# Patient Record
Sex: Female | Born: 2000 | Race: Black or African American | Hispanic: No | Marital: Single | Smoking: Never smoker
Health system: Southern US, Community
[De-identification: ages and names within clinical notes are randomized; demographics above are authoritative.]

## PROBLEM LIST (undated history)

## (undated) DIAGNOSIS — F909 Attention-deficit hyperactivity disorder, unspecified type: Secondary | ICD-10-CM

## (undated) HISTORY — PX: TONSILLECTOMY: SUR1361

## (undated) HISTORY — PX: VULVA SURGERY: SHX837

---
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
## 2021-06-11 ENCOUNTER — Other Ambulatory Visit: Payer: Self-pay

## 2021-06-11 ENCOUNTER — Emergency Department (HOSPITAL_COMMUNITY)
Admission: EM | Admit: 2021-06-11 | Discharge: 2021-06-12 | Disposition: A | Discharge status: Home / Self Care | Payer: 59 | Attending: Emergency Medicine | Admitting: Emergency Medicine

## 2021-06-11 DIAGNOSIS — R112 Nausea with vomiting, unspecified: Secondary | ICD-10-CM | POA: Diagnosis not present

## 2021-06-11 DIAGNOSIS — Z91040 Latex allergy status: Secondary | ICD-10-CM | POA: Insufficient documentation

## 2021-06-11 DIAGNOSIS — R10A2 Flank pain, left side: Secondary | ICD-10-CM

## 2021-06-11 DIAGNOSIS — R300 Dysuria: Secondary | ICD-10-CM

## 2021-06-11 DIAGNOSIS — Z79899 Other long term (current) drug therapy: Secondary | ICD-10-CM | POA: Insufficient documentation

## 2021-06-11 DIAGNOSIS — R109 Unspecified abdominal pain: Secondary | ICD-10-CM | POA: Diagnosis not present

## 2021-06-12 ENCOUNTER — Emergency Department (HOSPITAL_COMMUNITY): Payer: 59

## 2021-06-12 ENCOUNTER — Encounter (HOSPITAL_COMMUNITY): Payer: Self-pay | Admitting: Emergency Medicine

## 2021-06-12 LAB — BASIC METABOLIC PANEL
Anion gap: 7 (ref 5–15)
BUN: 15 mg/dL (ref 6–20)
CO2: 27 mmol/L (ref 22–32)
Calcium: 9.1 mg/dL (ref 8.9–10.3)
Chloride: 106 mmol/L (ref 98–111)
Creatinine, Ser: 0.84 mg/dL (ref 0.44–1.00)
GFR, Estimated: >60 mL/min (ref >60)
Glucose, Bld: 60 mg/dL — ABNORMAL LOW (ref 70–99)
Potassium: 3.6 mmol/L (ref 3.5–5.1)
Sodium: 140 mmol/L (ref 135–145)

## 2021-06-12 LAB — URINALYSIS, ROUTINE W REFLEX MICROSCOPIC
Bilirubin Urine: NEGATIVE
Glucose, UA: NEGATIVE mg/dL
Hgb urine dipstick: NEGATIVE
Ketones, ur: NEGATIVE mg/dL
Leukocytes,Ua: NEGATIVE
Nitrite: NEGATIVE
Protein, ur: NEGATIVE mg/dL
Specific Gravity, Urine: 1.026 (ref 1.005–1.030)
pH: 6 (ref 5.0–8.0)

## 2021-06-12 LAB — CBC
HCT: 38.5 % (ref 36.0–46.0)
Hemoglobin: 12.7 g/dL (ref 12.0–15.0)
MCH: 29.8 pg (ref 26.0–34.0)
MCHC: 33 g/dL (ref 30.0–36.0)
MCV: 90.4 fL (ref 80.0–100.0)
Platelets: 194 10*3/uL (ref 150–400)
RBC: 4.26 MIL/uL (ref 3.87–5.11)
RDW: 13.6 % (ref 11.5–15.5)
WBC: 7.7 10*3/uL (ref 4.0–10.5)
nRBC: 0 % (ref 0.0–0.2)

## 2021-06-12 LAB — CBG MONITORING, ED: Glucose-Capillary: 92 mg/dL (ref 70–99)

## 2021-06-12 LAB — I-STAT BETA HCG BLOOD, ED (MC, WL, AP ONLY): I-stat hCG, quantitative: <5 m[IU]/mL (ref <5)

## 2021-06-12 MED ORDER — KETOROLAC TROMETHAMINE 30 MG/ML IJ SOLN
30.0000 mg | Freq: Once | INTRAMUSCULAR | Status: AC
  Administered 2021-06-12: 30 mg via INTRAVENOUS
  Filled 2021-06-12: qty 1

## 2021-06-12 MED ORDER — ONDANSETRON HCL 4 MG/2ML IJ SOLN
4.0000 mg | Freq: Once | INTRAMUSCULAR | Status: AC
  Administered 2021-06-12: 4 mg via INTRAVENOUS
  Filled 2021-06-12: qty 2

## 2021-06-12 MED ORDER — SODIUM CHLORIDE 0.9 % IV BOLUS (SEPSIS)
1000.0000 mL | Freq: Once | INTRAVENOUS | Status: AC
  Administered 2021-06-12: 1000 mL via INTRAVENOUS

## 2021-06-12 MED ORDER — LACTATED RINGERS IV BOLUS
1000.0000 mL | Freq: Once | INTRAVENOUS | Status: AC
  Administered 2021-06-12: 1000 mL via INTRAVENOUS

## 2021-06-12 MED ORDER — ONDANSETRON 8 MG PO TBDP
8.0000 mg | ORAL_TABLET | Freq: Once | ORAL | Status: AC
Start: 2021-06-12 — End: 2021-06-12
  Administered 2021-06-12: 8 mg via ORAL
  Filled 2021-06-12: qty 1

## 2021-06-12 MED ORDER — NITROFURANTOIN MONOHYD MACRO 100 MG PO CAPS: 100.0000 mg | ORAL_CAPSULE | Freq: Two times a day (BID) | ORAL | 0 refills | Status: DC

## 2021-06-12 MED ORDER — ONDANSETRON 8 MG PO TBDP
ORAL_TABLET | ORAL | 0 refills | Status: DC
Start: 2021-06-12 — End: 2023-11-29

## 2021-06-12 NOTE — ED Notes (Signed)
CBG was 92 

## 2021-06-12 NOTE — ED Triage Notes (Signed)
Pt reports that she has had difficulty urinating for the past week. Pt reports back pain and spasms for the past week. Pt reports nausea when urinating.

## 2021-06-12 NOTE — ED Notes (Signed)
Bladder scan volume: 39 mL.

## 2021-06-12 NOTE — ED Provider Notes (Signed)
[facility]-EMERGENCY DEPT Provider Note   CSN: [id number] Arrival date & time: 06/11/21  2353     History Chief Complaint  Patient presents with   Urinary Retention   Flank Pain    Julia Gross is a 20 y.o. female.  The history is provided by the patient.  Flank Pain This is a new problem. The current episode started more than 2 days ago. The problem occurs daily. The problem has been gradually worsening. Pertinent negatives include no abdominal pain. Nothing aggravates the symptoms. Nothing relieves the symptoms.  Patient presents for concern for urinary tract infection.  Patient reports for over a week she has had difficulty urinating and only urinating small volumes.  She then began having flank pain. She reports over the past day she has been having vomiting.  No fevers.  No abdominal pain.  No vaginal bleeding.  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     Family History  Problem Relation Age of Onset   Depression Mother    Anxiety disorder Mother    Depression Maternal Grandmother    Arthritis Maternal Grandmother    Diabetes Maternal Grandfather    Cancer Maternal Grandfather    ADD / ADHD Cousin     Social History   Tobacco Use   Smoking status: Never   Smokeless tobacco: Never  Substance Use Topics   Alcohol use: No   Drug use: No    Home Medications Prior to Admission medications   Medication Sig Start Date End Date Taking? Authorizing Provider   nitrofurantoin, macrocrystal-monohydrate, (MACROBID) 100 MG capsule Take 1 capsule (100 mg total) by mouth 2 (two) times daily. 06/12/21  Yes Zadie Rhine, MD  ondansetron (ZOFRAN ODT) 8 MG disintegrating tablet 8mg  ODT q4 hours prn nausea 06/12/21  Yes 06/14/21, MD  carbamide peroxide (DEBROX) 6.5 % OTIC solution Place 5 drops into the left ear 2 (two) times daily. 10/31/18   12/30/18, Amy V, PA-C  fluticasone (FLONASE) 50 MCG/ACT nasal spray Place 2 sprays into both nostrils daily. 10/31/18   12/30/18, PA-C    Allergies    Latex  Review of Systems   Review of Systems  Gastrointestinal:  Negative for abdominal pain.  Genitourinary:  Positive for difficulty urinating and flank pain.  All other systems reviewed and are negative.  Physical Exam Updated Vital Signs BP 117/63   Pulse 60   Temp 98.3 F (36.8 C) (Oral)   Resp 18   Ht 1.626 m (5\' 4" )   Wt 66.2 kg   SpO2 100%   BMI 25.06 kg/m   Physical Exam CONSTITUTIONAL: Well developed/well nourished HEAD: Normocephalic/atraumatic EYES: EOMI/PERRL ENMT: Mucous membranes moist NECK: supple no meningeal signs SPINE/BACK:entire spine nontender  CV: S1/S2 noted, no murmurs/rubs/gallops noted LUNGS: Lungs are clear to auscultation bilaterally, no apparent distress ABDOMEN: soft, nontender, no rebound or guarding GU: Left cva tenderness NEURO: Awake/alert,  equal motor 5/5 strength noted with the following: hip flexion/knee flexion/extension, foot dorsi/plantar flexion, great toe extension intact bilaterally  Pt is able to ambulate unassisted. EXTREMITIES: pulses normal, full ROM SKIN: warm, color normal PSYCH: no abnormalities of mood noted, alert and oriented to situation  ED Results / Procedures / Treatments   Labs (all labs ordered are listed, but only abnormal results are displayed) Labs Reviewed  BASIC METABOLIC PANEL - Abnormal; Notable for the following components:      Result Value   Glucose, Bld 60 (*)    All  other components within normal limits  URINALYSIS, ROUTINE W REFLEX MICROSCOPIC  CBC  I-STAT BETA HCG BLOOD, ED ([facility], [facility], [facility] ONLY)  CBG MONITORING, ED    EKG None  Radiology CT Renal Stone Study  Result Date: 06/12/2021 CLINICAL DATA:  20 year old female with difficulty urinating for the past week. Back and flank pain with spasm. Nausea when urinating. EXAM: CT ABDOMEN AND PELVIS WITHOUT CONTRAST TECHNIQUE: Multidetector CT imaging of the abdomen and pelvis was performed following the standard protocol without IV contrast. COMPARISON:  None. FINDINGS: Lower chest: Negative. Hepatobiliary: Contracted gallbladder.  Negative noncontrast liver. Pancreas: Negative. Spleen: Negative. Adrenals/Urinary Tract: Normal adrenal glands. Noncontrast kidneys appears symmetric and nonobstructed. No nephrolithiasis or perinephric inflammation. Proximal ureters seem symmetric and decompressed. Unremarkable bladder. Stomach/Bowel: Mildly redundant large bowel with retained stool. Normal appendix is visible on coronal image 65 tracking cephalad and posterior. No large bowel inflammation. No dilated small bowel. Stomach and duodenum are decompressed. No free air, free fluid, or mesenteric inflammation is identified. Vascular/Lymphatic: Normal caliber abdominal aorta. No calcified atherosclerosis. No lymphadenopathy is evident. Reproductive: Negative noncontrast appearance. Other: No pelvic free fluid is evident. Musculoskeletal: Spina bifida occulta at T12, normal variant. 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 No pars fracture or spondylolisthesis. No acute osseous abnormality identified. IMPRESSION: 1. Largely negative noncontrast CT Abdomen and Pelvis. No urinary calculus or obstructive uropathy. Normal appendix. 2. 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 No spondylolisthesis. Electronically Signed   By: Odessa Fleming M.D.   On: 06/12/2021 05:41     Procedures Procedures   Medications Ordered in ED Medications  ondansetron (ZOFRAN-ODT) disintegrating tablet 8 mg (8 mg Oral Given 06/12/21 0216)  sodium chloride 0.9 % bolus 1,000 mL (0 mLs Intravenous Stopped 06/12/21 0518)  ondansetron (ZOFRAN) injection 4 mg (4 mg Intravenous Given 06/12/21 0346)  ketorolac (TORADOL) 30 MG/ML injection 30 mg (30 mg Intravenous Given 06/12/21 0347)  ondansetron (ZOFRAN) injection 4 mg (4 mg Intravenous Given 06/12/21 0526)  lactated ringers bolus 1,000 mL (1,000 mLs Intravenous New Bag/Given 06/12/21 0525)    ED Course  I have reviewed the triage vital signs and the nursing notes.  Pertinent labs results that were available during my care of the patient were reviewed by me and considered in my medical decision making (see chart for details).    MDM Rules/Calculators/A&P                           Patient reports she been having difficulty urinating and flank pain.  Patient had no signs of urinary retention on initial evaluation.  Labs were overall reassuring.  We attempted oral medications, and on reassessment patient was leaning over a trash can and spitting. Will place IV to give fluids and medication  Patient still reporting nausea, vomiting and pain.  Due to persistent flank pain,  will proceed with CT renal studies 6:27 AM Patient is improving.  When I reevaluated patient she was eating [place] fries.  Pain appears controlled.  Extensive evaluation has been unrevealing.  Due to dysuria/difficulty urinating, patient request antibiotics and nausea medicines.  Will refer to urology. Final Clinical Impression(s) / ED Diagnoses Final diagnoses:  Dysuria  Left flank pain    Rx / DC Orders ED Discharge Orders          Ordered    ondansetron (ZOFRAN ODT) 8 MG disintegrating tablet        06/12/21 0621    nitrofurantoin, macrocrystal-monohydrate, (MACROBID) 100 MG capsule  2 times daily        06/12/21 4742             Zadie Rhine,  MD 06/12/21 [phone]

## 2021-11-28 ENCOUNTER — Other Ambulatory Visit: Payer: Self-pay

## 2021-11-28 ENCOUNTER — Encounter (HOSPITAL_COMMUNITY): Payer: Self-pay | Admitting: Emergency Medicine

## 2021-11-28 ENCOUNTER — Emergency Department (HOSPITAL_COMMUNITY)
Admission: EM | Admit: 2021-11-28 | Discharge: 2021-11-28 | Disposition: A | Discharge status: Home / Self Care | Payer: 59 | Attending: Emergency Medicine | Admitting: Emergency Medicine

## 2021-11-28 DIAGNOSIS — Z20822 Contact with and (suspected) exposure to covid-19: Secondary | ICD-10-CM | POA: Diagnosis not present

## 2021-11-28 DIAGNOSIS — Z91040 Latex allergy status: Secondary | ICD-10-CM | POA: Diagnosis not present

## 2021-11-28 DIAGNOSIS — Z79899 Other long term (current) drug therapy: Secondary | ICD-10-CM | POA: Diagnosis not present

## 2021-11-28 DIAGNOSIS — R0602 Shortness of breath: Secondary | ICD-10-CM | POA: Insufficient documentation

## 2021-11-28 DIAGNOSIS — J029 Acute pharyngitis, unspecified: Secondary | ICD-10-CM | POA: Insufficient documentation

## 2021-11-28 DIAGNOSIS — R55 Syncope and collapse: Secondary | ICD-10-CM | POA: Insufficient documentation

## 2021-11-28 LAB — CBC WITH DIFFERENTIAL/PLATELET
Abs Immature Granulocytes: 0 10*3/uL (ref 0.00–0.07)
Basophils Absolute: 0 10*3/uL (ref 0.0–0.1)
Basophils Relative: 0 %
Eosinophils Absolute: 0.3 10*3/uL (ref 0.0–0.5)
Eosinophils Relative: 4 %
HCT: 39 % (ref 36.0–46.0)
Hemoglobin: 12.7 g/dL (ref 12.0–15.0)
Lymphocytes Relative: 57 %
Lymphs Abs: 3.8 10*3/uL (ref 0.7–4.0)
MCH: 28.4 pg (ref 26.0–34.0)
MCHC: 32.6 g/dL (ref 30.0–36.0)
MCV: 87.2 fL (ref 80.0–100.0)
Monocytes Absolute: 0.4 10*3/uL (ref 0.1–1.0)
Monocytes Relative: 6 %
Neutro Abs: 2.2 10*3/uL (ref 1.7–7.7)
Neutrophils Relative %: 33 %
Platelets: 164 10*3/uL (ref 150–400)
RBC: 4.47 MIL/uL (ref 3.87–5.11)
RDW: 13.3 % (ref 11.5–15.5)
WBC: 6.6 10*3/uL (ref 4.0–10.5)
nRBC: 0 % (ref 0.0–0.2)
nRBC: 0 /100 WBC

## 2021-11-28 LAB — BASIC METABOLIC PANEL
Anion gap: 7 (ref 5–15)
BUN: 14 mg/dL (ref 6–20)
CO2: 25 mmol/L (ref 22–32)
Calcium: 8.8 mg/dL — ABNORMAL LOW (ref 8.9–10.3)
Chloride: 103 mmol/L (ref 98–111)
Creatinine, Ser: 0.69 mg/dL (ref 0.44–1.00)
GFR, Estimated: >60 mL/min (ref >60)
Glucose, Bld: 96 mg/dL (ref 70–99)
Potassium: 3.6 mmol/L (ref 3.5–5.1)
Sodium: 135 mmol/L (ref 135–145)

## 2021-11-28 LAB — RESP PANEL BY RT-PCR (FLU A&B, COVID) ARPGX2
Influenza A by PCR: NEGATIVE
Influenza B by PCR: NEGATIVE
SARS Coronavirus 2 by RT PCR: NEGATIVE

## 2021-11-28 LAB — I-STAT BETA HCG BLOOD, ED (MC, WL, AP ONLY): I-stat hCG, quantitative: <5 m[IU]/mL (ref <5)

## 2021-11-28 LAB — GROUP A STREP BY PCR: Group A Strep by PCR: NOT DETECTED

## 2021-11-28 LAB — MONONUCLEOSIS SCREEN: Mono Screen: NEGATIVE

## 2021-11-28 MED ORDER — ACETAMINOPHEN 325 MG PO TABS
650.0000 mg | ORAL_TABLET | Freq: Once | ORAL | Status: AC
  Administered 2021-11-28: 650 mg via ORAL
  Filled 2021-11-28: qty 2

## 2021-11-28 MED ORDER — SODIUM CHLORIDE 0.9 % IV BOLUS
1000.0000 mL | Freq: Once | INTRAVENOUS | Status: AC
  Administered 2021-11-28: 1000 mL via INTRAVENOUS

## 2021-11-28 NOTE — Discharge Instructions (Addendum)
You were seen in the emergency department for sore throat and a near fainting spell.  You had blood work EKG, strep test, COVID and flu test that did not show an obvious explanation for your symptoms.  Please use warm salt water gargle for your throat.  Keep well-hydrated.  Schedule a follow-up appointment with your primary care doctor.  Return to the emergency department if any worsening or concerning symptoms

## 2021-11-28 NOTE — ED Provider Notes (Signed)
[facility] EMERGENCY DEPARTMENT Provider Note   CSN: [id number] Arrival date & time: 11/28/21  1549     History  Chief Complaint  Patient presents with   Sore Throat    Julia Gross is a 21 y.o. female. She states she was running yesterday and collapsed being weak all over.  She denies syncope.  She is felt weak since then and has had an on-and-off sore throat.  Worse with swallowing.  No fevers or chills.  No sick contacts or recent travel.  No chest pain.  She said sometimes she gets tingling in her hands and feet.  The history is provided by the patient.  Sore Throat This is a new problem. The current episode started yesterday. The problem has not changed since onset.Associated symptoms include shortness of breath. Pertinent negatives include no chest pain, no abdominal pain and no headaches. The symptoms are aggravated by swallowing. Nothing relieves the symptoms. She has tried nothing for the symptoms. The treatment provided no relief.      Home Medications Prior to Admission medications   Medication Sig Start Date End Date Taking? Authorizing Provider  carbamide peroxide (DEBROX) 6.5 % OTIC solution Place 5 drops into the left ear 2 (two) times daily. 10/31/18   Cathie Hoops, Amy V, PA-C  fluticasone (FLONASE) 50 MCG/ACT nasal spray Place 2 sprays into both nostrils daily. 10/31/18   Cathie Hoops, Amy V, PA-C  nitrofurantoin, macrocrystal-monohydrate, (MACROBID) 100 MG capsule Take 1 capsule (100 mg total) by mouth 2 (two) times daily. 06/12/21   Zadie Rhine, MD  ondansetron (ZOFRAN ODT) 8 MG disintegrating tablet 8mg  ODT q4 hours prn nausea 06/12/21   06/14/21, MD      Allergies    Latex    Review of Systems   Review of Systems  Constitutional:  Positive for fatigue. Negative for fever.  HENT:  Positive for sore throat.   Respiratory:  Positive for shortness of breath.   Cardiovascular:  Negative for chest pain.  Gastrointestinal:  Negative for abdominal pain.   Genitourinary:  Negative for dysuria.  Musculoskeletal:  Negative for neck pain.  Skin:  Negative for rash.  Neurological:  Positive for light-headedness. Negative for headaches.   Physical Exam Updated Vital Signs BP 136/89 (BP Location: Right Arm)    Pulse (!) 113    Temp 98.8 F (37.1 C) (Oral)    Resp 16    LMP 11/26/2021    SpO2 100%  Physical Exam Vitals and nursing note reviewed.  Constitutional:      General: She is not in acute distress.    Appearance: She is well-developed.  HENT:     Head: Normocephalic and atraumatic.     Mouth/Throat:     Mouth: Mucous membranes are moist. No oral lesions.     Pharynx: Oropharynx is clear. Uvula midline. No oropharyngeal exudate, posterior oropharyngeal erythema or uvula swelling.  Eyes:     Conjunctiva/sclera: Conjunctivae normal.  Cardiovascular:     Rate and Rhythm: Normal rate and regular rhythm.     Heart sounds: No murmur heard. Pulmonary:     Effort: Pulmonary effort is normal. No respiratory distress.     Breath sounds: Normal breath sounds.  Abdominal:     Palpations: Abdomen is soft.     Tenderness: There is no abdominal tenderness.  Musculoskeletal:        General: No swelling.     Cervical back: Neck supple.  Skin:    General: Skin is warm and  dry.     Capillary Refill: Capillary refill takes less than 2 seconds.  Neurological:     General: No focal deficit present.     Mental Status: She is alert.     Cranial Nerves: No cranial nerve deficit.     Sensory: No sensory deficit.     Motor: No weakness.     Gait: Gait normal.  Psychiatric:        Mood and Affect: Mood normal.    ED Results / Procedures / Treatments   Labs (all labs ordered are listed, but only abnormal results are displayed) Labs Reviewed  BASIC METABOLIC PANEL - Abnormal; Notable for the following components:      Result Value   Calcium 8.8 (*)    All other components within normal limits  GROUP A STREP BY PCR  RESP PANEL BY RT-PCR (FLU  A&B, COVID) ARPGX2  CBC WITH DIFFERENTIAL/PLATELET  MONONUCLEOSIS SCREEN  I-STAT BETA HCG BLOOD, ED ([facility], [facility], [facility] ONLY)    EKG EKG Interpretation  Date/Time:  Sunday November 28 2021 18:58:45 EST Ventricular Rate:  82 PR Interval:  142 QRS Duration: 90 QT Interval:  366 QTC Calculation: 427 R Axis:   74 Text Interpretation: Normal sinus rhythm Normal ECG When compared with ECG of 16-Nov-2017 17:47, No significant change since last tracing Confirmed by Meridee Score [phone]) on 11/28/2021 7:02:06 PM  Radiology No results found.  Procedures Procedures    Medications Ordered in ED Medications  sodium chloride 0.9 % bolus 1,000 mL (has no administration in time range)    ED Course/ Medical Decision Making/ A&P Clinical Course as of 11/28/21 2149  Wynelle Link Nov 28, 2021  1913 Patient eating dinner without any difficulty. [MB]    Clinical Course User Index [MB] Terrilee Files, MD                           Medical Decision Making Amount and/or Complexity of Data Reviewed Labs: ordered.  Risk OTC drugs.  Julia Gross was evaluated in Emergency Department on 11/28/2021 for the symptoms described in the history of present illness. She was evaluated in the context of the global COVID-19 pandemic, which necessitated consideration that the patient might be at risk for infection with the SARS-CoV-2 virus that causes COVID-19. Institutional protocols and algorithms that pertain to the evaluation of patients at risk for COVID-19 are in a state of rapid change based on information released by regulatory bodies including the CDC and federal and state organizations. These policies and algorithms were followed during the patient's care in the ED.  This patient complains of sore throat, feeling weak, near syncope; this involves an extensive number of treatment Options and is a complaint that carries with it a high risk of complications and Morbidity. The differential includes dehydration,  strep, COVID, flu, metabolic derangement, anemia  I ordered, reviewed and interpreted labs, which included CBC with normal white count normal hemoglobin, chemistries normal, COVID and flu negative, strep negative, pregnancy test negative, mono negative I ordered medication IV fluids  Additional history obtained from patient's mother, sounds like she has had multiple episodes of near syncope. Previous records obtained and reviewed in epic no recent admissions  After the interventions stated above, I reevaluated the patient and found patient to be awake alert hemodynamically stable.Lab work-up unrevealing.  Recommended close follow-up with her treating providers.  Return instructions discussed no indications for admission at this time  Final Clinical Impression(s) / ED Diagnoses Final diagnoses:  Sore throat  Near syncope    Rx / DC Orders ED Discharge Orders     None         Terrilee FilesButler, Taylon Coole C, MD 11/28/21 2153

## 2021-11-28 NOTE — ED Triage Notes (Signed)
Pt states, "I lost the ability to run yesterday."  States she was in a store and was running to someone and her "body collapsed" and she fell to the ground.  Denies LOC.  Denies injury.  Reports sore throat and difficulty swallowing today.

## 2021-12-23 ENCOUNTER — Inpatient Hospital Stay (HOSPITAL_COMMUNITY): Payer: 59

## 2021-12-23 ENCOUNTER — Inpatient Hospital Stay (HOSPITAL_COMMUNITY)
Admission: AD | Admit: 2021-12-23 | Discharge: 2021-12-24 | Disposition: A | Discharge status: Home / Self Care | Payer: 59 | Attending: Emergency Medicine | Admitting: Emergency Medicine

## 2021-12-23 ENCOUNTER — Encounter (HOSPITAL_COMMUNITY): Payer: Self-pay | Admitting: Obstetrics & Gynecology

## 2021-12-23 ENCOUNTER — Other Ambulatory Visit: Payer: Self-pay

## 2021-12-23 DIAGNOSIS — Z3202 Encounter for pregnancy test, result negative: Secondary | ICD-10-CM | POA: Diagnosis not present

## 2021-12-23 DIAGNOSIS — R1032 Left lower quadrant pain: Secondary | ICD-10-CM | POA: Diagnosis present

## 2021-12-23 DIAGNOSIS — R110 Nausea: Secondary | ICD-10-CM | POA: Diagnosis not present

## 2021-12-23 DIAGNOSIS — N939 Abnormal uterine and vaginal bleeding, unspecified: Secondary | ICD-10-CM | POA: Insufficient documentation

## 2021-12-23 DIAGNOSIS — M549 Dorsalgia, unspecified: Secondary | ICD-10-CM | POA: Insufficient documentation

## 2021-12-23 LAB — WET PREP, GENITAL
Clue Cells Wet Prep HPF POC: NONE SEEN
Sperm: NONE SEEN
Trich, Wet Prep: NONE SEEN
WBC, Wet Prep HPF POC: <10 (ref <10)
Yeast Wet Prep HPF POC: NONE SEEN

## 2021-12-23 LAB — CBC WITH DIFFERENTIAL/PLATELET
Abs Immature Granulocytes: 0.01 10*3/uL (ref 0.00–0.07)
Basophils Absolute: 0 10*3/uL (ref 0.0–0.1)
Basophils Relative: 1 %
Eosinophils Absolute: 0.4 10*3/uL (ref 0.0–0.5)
Eosinophils Relative: 5 %
HCT: 34.8 % — ABNORMAL LOW (ref 36.0–46.0)
Hemoglobin: 11.2 g/dL — ABNORMAL LOW (ref 12.0–15.0)
Immature Granulocytes: 0 %
Lymphocytes Relative: 53 %
Lymphs Abs: 4.1 10*3/uL — ABNORMAL HIGH (ref 0.7–4.0)
MCH: 28.6 pg (ref 26.0–34.0)
MCHC: 32.2 g/dL (ref 30.0–36.0)
MCV: 89 fL (ref 80.0–100.0)
Monocytes Absolute: 0.6 10*3/uL (ref 0.1–1.0)
Monocytes Relative: 8 %
Neutro Abs: 2.5 10*3/uL (ref 1.7–7.7)
Neutrophils Relative %: 33 %
Platelets: 149 10*3/uL — ABNORMAL LOW (ref 150–400)
RBC: 3.91 MIL/uL (ref 3.87–5.11)
RDW: 14.1 % (ref 11.5–15.5)
WBC: 7.7 10*3/uL (ref 4.0–10.5)
nRBC: 0 % (ref 0.0–0.2)

## 2021-12-23 LAB — COMPREHENSIVE METABOLIC PANEL
ALT: 22 U/L (ref 0–44)
AST: 31 U/L (ref 15–41)
Albumin: 3.2 g/dL — ABNORMAL LOW (ref 3.5–5.0)
Alkaline Phosphatase: 50 U/L (ref 38–126)
Anion gap: 5 (ref 5–15)
BUN: 14 mg/dL (ref 6–20)
CO2: 28 mmol/L (ref 22–32)
Calcium: 8.4 mg/dL — ABNORMAL LOW (ref 8.9–10.3)
Chloride: 105 mmol/L (ref 98–111)
Creatinine, Ser: 0.8 mg/dL (ref 0.44–1.00)
GFR, Estimated: >60 mL/min (ref >60)
Glucose, Bld: 64 mg/dL — ABNORMAL LOW (ref 70–99)
Potassium: 3.5 mmol/L (ref 3.5–5.1)
Sodium: 138 mmol/L (ref 135–145)
Total Bilirubin: 0.6 mg/dL (ref 0.3–1.2)
Total Protein: 6.1 g/dL — ABNORMAL LOW (ref 6.5–8.1)

## 2021-12-23 LAB — LIPASE, BLOOD: Lipase: 38 U/L (ref 11–51)

## 2021-12-23 LAB — CBG MONITORING, ED: Glucose-Capillary: 78 mg/dL (ref 70–99)

## 2021-12-23 LAB — POCT PREGNANCY, URINE: Preg Test, Ur: NEGATIVE

## 2021-12-23 MED ORDER — PROCHLORPERAZINE EDISYLATE 10 MG/2ML IJ SOLN
10.0000 mg | Freq: Once | INTRAMUSCULAR | Status: AC
  Administered 2021-12-23: 10 mg via INTRAVENOUS
  Filled 2021-12-23: qty 2

## 2021-12-23 MED ORDER — DEXTROSE 50 % IV SOLN: 1.0000 | Freq: Once | INTRAVENOUS | Status: DC

## 2021-12-23 MED ORDER — DIPHENHYDRAMINE HCL 50 MG/ML IJ SOLN
12.5000 mg | Freq: Once | INTRAMUSCULAR | Status: AC
  Administered 2021-12-23: 12.5 mg via INTRAVENOUS
  Filled 2021-12-23: qty 1

## 2021-12-23 MED ORDER — SODIUM CHLORIDE 0.9 % IV BOLUS
1000.0000 mL | Freq: Once | INTRAVENOUS | Status: AC
  Administered 2021-12-23: 1000 mL via INTRAVENOUS

## 2021-12-23 MED ORDER — KETOROLAC TROMETHAMINE 15 MG/ML IJ SOLN
15.0000 mg | Freq: Once | INTRAMUSCULAR | Status: AC
  Administered 2021-12-23: 15 mg via INTRAVENOUS
  Filled 2021-12-23: qty 1

## 2021-12-23 NOTE — ED Provider Notes (Signed)
[facility] EMERGENCY DEPARTMENT Provider Note  History   Chief Complaint  Patient presents with   Back Pain   Vaginal Bleeding   Julia Gross is a 21 y.o. female w/ h/o L inguinal hernia s/p surgical repair, ovarian cysts, chronic nausea, chronic headaches who p/w LLQ pain.  The history is provided by the patient and the spouse.  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  HENT:  Negative for ear pain and sore throat.   Eyes:  Negative for pain and visual disturbance.  Respiratory:  Negative for cough and shortness of breath.   Cardiovascular:  Negative for chest pain and palpitations.  Gastrointestinal:  Positive for abdominal pain and nausea. Negative for blood in stool and vomiting.  Genitourinary:  Positive for vaginal bleeding. Negative for dysuria, flank pain and hematuria.  Musculoskeletal:  Negative for arthralgias and back pain.  Skin:  Negative for color change and  rash.  Neurological:  Negative for seizures and syncope.  All other systems reviewed and are negative.   Physical Exam   Today's Vitals   12/23/21 2255 12/23/21 2300 12/23/21 2330 12/24/21 0026  BP: 117/78 125/68 120/78   Pulse: 67 86 71   Resp: 20 20 16    Temp:   98 F (36.7 C)   TempSrc:   Oral   SpO2: 100% 100% 100%   Weight:      Height:      PainSc:    0-No pain     Physical Exam Vitals and nursing note reviewed.  Constitutional:      General: She is not in acute distress.    Appearance: Normal appearance. She is well-developed. She is obese.  HENT:     Head: Normocephalic and atraumatic.     Nose: Nose normal. No congestion.     Mouth/Throat:     Mouth: Mucous membranes are moist.     Pharynx: Oropharynx is clear. No oropharyngeal exudate.  Eyes:     Extraocular Movements: Extraocular movements intact.     Conjunctiva/sclera: Conjunctivae normal.     Pupils: Pupils are equal, round, and reactive to light.  Cardiovascular:     Rate and Rhythm: Regular rhythm.     Pulses: Normal pulses.     Heart sounds: Normal heart sounds. No murmur heard. Pulmonary:     Effort: Pulmonary effort is normal.  No respiratory distress.     Breath sounds: Normal breath sounds. No wheezing, rhonchi or rales.  Abdominal:     General: There is no distension.     Palpations: Abdomen is soft.     Tenderness: There is no abdominal tenderness. There is no right CVA tenderness, left CVA tenderness, guarding or rebound.  Musculoskeletal:        General: No swelling.     Cervical back: Normal range of motion and neck supple.     Right lower leg: No edema.     Left lower leg: No edema.  Skin:    General: Skin is warm and dry.     Capillary Refill: Capillary refill takes less than 2 seconds.     Findings: No rash.  Neurological:     General: No focal deficit present.     Mental Status: She is alert and oriented to person, place, and time. Mental status is at baseline.     Cranial  Nerves: No cranial nerve deficit.     Sensory: No sensory deficit.     Motor: No weakness.     Coordination: Coordination normal.  Psychiatric:        Mood and Affect: Mood normal.        Behavior: Behavior normal.    ED Course  Procedures  Medical Decision Making:  Julia Gross is a 21 y.o. female w/ h/o L inguinal hernia s/p surgical repair, ovarian cysts, chronic nausea, chronic headaches who p/w LLQ pain.  Monogamous and sexually active with one female partner  No fever No abnormal vaginal discharge now but reports foul smelling discharge once last week LMP 3 wk ago, reports starting vaginal bleeding today "like normal period" Poor po intake, endorses intermittent lightheadedness w/o syncope Today developed suprapubic/LLQ pain radiating around to low back (not flank), pain intermittent and throbbing Chronic nausea last emesis, 10d ago, uses promethazine at home Chronic intermittent non-bloody diarrhea. Last norma BM this morning  Hemodynamically stable Denies AC Denies IVDU Denies h/o AAA Denies saddle anesthesia  Denies urinary or fecal incontinence  Denies recent falls or trauma   HA on exam, no focal neuro deficit, will give migraine cocktail Pelvic exam with blood in vaginal vault, no trauma, no abnormal discharge, normal cervix, no CMT or adnexal TTP.  Abdomen soft and non-tender, no flank pain Given reported pain in LLQ, will obtain TV US to r/o torsion  ER provider interpretation of Imaging / Radiology:  Transvaginal ultrasound pelvis: Both right and left ovary no appearance, no adnexal mass, intact arterial and venous flow (thus no torsion), small amount of free fluid in the pelvis, unremarkable pelvic ultrasound.  ER provider interpretation of EKG:  Not indicated  ER provider interpretation of Labs:  UPT negative CBC WBC 7.7, Hgb 11.2 CMP: No emergent electrolyte abnormality, BG 64 (given D50), no AKI, no elevated AST/ALT Lipase 38 UA: Without UTI,  moderate blood (patient on menstrual cycle) Wet prep: Negative for yeast, trichomoniasis, or clue cells GC/chlamydia: Negative  Key medications administered in the ER:  Medications  ketorolac (TORADOL) 15 MG/ML injection 15 mg (15 mg Intravenous Given 12/23/21 2248)  prochlorperazine (COMPAZINE) injection 10 mg (10 mg Intravenous Given 12/23/21 2248)  diphenhydrAMINE (BENADRYL) injection 12.5 mg (12.5 mg Intravenous Given 12/23/21 2248)  sodium chloride 0.9 % bolus 1,000 mL (0 mLs Intravenous Stopped 12/23/21 2337)    Diagnoses considered:  Unknown etiology, may relate to ruptured small previous ovarian cyst as none seen on TV US. With presenting  history and physical exam, doubt bowel obstruction, AAA, ACS, PNA, PTX, pyelonephritis, nephrolithiasis, pancreatitis, cholecystitis, shingles, perforated bowel or ulcer, diverticulosis/diverticulitis, ischemic mesentery, inflammatory bowel disease, strangulated/incarcerated hernia, gastritis, PUD, or PID. Patient without peritoneal signs or other indication of need for surgical intervention.   I do not think that Raelene Bott is experiencing cauda equina syndrome, abdominal aortic aneurysm, epidural abscess, aortic dissection, spinal hematoma, nephrolithiasis, spinal metastasis, discitis, or an acute fracture.  Consulted: None  Given the patient's reassuring presentation, I believe that she is safe for discharge.  I provided ED return precautions, specifically for the symptoms which are most concerning (e.g., saddle anesthesia, urinary or bowel incontinence or retention, changing or worsening pain), which would necessitate immediate return.  I encouraged the patient to followup with their PCP.  Patient seen in conjunction with Dr. Nicola Girt medical dictation software was used in the creation of this note.   Electronically signed by: Wynetta Fines, MD on 12/24/2021 at 2:42 PM  Clinical Impression:  1. LLQ pain     Dispo: Discharge     Wynetta Fines, MD 12/25/21 1423    Lajean Saver, MD 12/28/21 [phone]

## 2021-12-23 NOTE — ED Notes (Signed)
Pt presents to the ED from home with complaints of ovarian cysts and vaginal bleeding for the last 4 days. Pt states she doesn't know if the cyst has ruptured, causing the pain in her pelvic area along with the bleeding, not currently on her period. Pt states she feels numb in her vagina when she has her period. Worse pain today than in the last 4 days, going through 1 pad per hour. Pt denies N/V/D. Pt states she has had increased pain to her pelvic area after urinating but doesn't feel like a normal uti, has back pain all over today. Pt resting on stretcher with family at bedside. No acute changes noted. Pt given blanket, blood work obtained and sent to lab. Will continue to monitor.

## 2021-12-23 NOTE — ED Notes (Signed)
Pt given sandwich bag and soda to improve glucose. MD states to hold D50 at this time. Pt denies any complaints of headache at this time. Pt also informed of the need for additional urine specimen. Will continue to monitor.

## 2021-12-23 NOTE — ED Notes (Signed)
Patient transported to Ultrasound 

## 2021-12-23 NOTE — MAU Provider Note (Signed)
°    None     S Ms. Julia Gross is a 21 y.o. No obstetric history on file. patient who presents to MAU today with complaint of states has had cysts on her right ovary and has been dealing with this from months. States she is having lower back pain and in her pelvis. Also has been dizzy and sick. Also having vaginal bleeding.    LMP: today   Onset of complaint: ongoing   Pain score: 4/10      Vitals:    12/23/21 1917  BP: 134/66  Pulse: 88  Resp: 16  Temp: 98.4 F (36.9 C)     Lab orders placed from triage: upt  .   O BP 134/66 (BP Location: Right Arm)    Pulse 88    Temp 98.4 F (36.9 C) (Oral)    Resp 16    LMP 11/26/2021  Physical Exam  A Medical screening exam complete  P Discharge from MAU in stable condition Patient given the option of transfer to [facility] for further evaluation or seek care in outpatient facility of choice would like to go to [facility] Christin Fudge, [place] 12/23/2021 7:20 PM

## 2021-12-23 NOTE — MAU Note (Signed)
Report given to [facility] RN 

## 2021-12-23 NOTE — MAU Note (Signed)
Julia Gross is a 21 y.o. here in MAU reporting: states has had cysts on her right ovary and has been dealing with this from months. States she is having lower back pain and in her pelvis. Also has been dizzy and sick. Also having vaginal bleeding.   LMP: today  Onset of complaint: ongoing  Pain score: 4/10  Vitals:   12/23/21 1917  BP: 134/66  Pulse: 88  Resp: 16  Temp: 98.4 F (36.9 C)     Lab orders placed from triage: upt

## 2021-12-24 LAB — URINALYSIS, ROUTINE W REFLEX MICROSCOPIC
Bacteria, UA: NONE SEEN
Bilirubin Urine: NEGATIVE
Glucose, UA: NEGATIVE mg/dL
Ketones, ur: NEGATIVE mg/dL
Leukocytes,Ua: NEGATIVE
Nitrite: NEGATIVE
Protein, ur: NEGATIVE mg/dL
Specific Gravity, Urine: 1.024 (ref 1.005–1.030)
pH: 6 (ref 5.0–8.0)

## 2021-12-24 LAB — GC/CHLAMYDIA PROBE AMP (~~LOC~~) NOT AT ARMC
Chlamydia: NEGATIVE
Comment: NEGATIVE
Comment: NORMAL
Neisseria Gonorrhea: NEGATIVE

## 2021-12-24 NOTE — Discharge Instructions (Addendum)
Call [phone] to establish primary care with [facility] if you do not have a primary care physician.  They should manage your ongoing multiple chronic complaints.  Ultrasound of your abdomen did not reveal any ovarian torsion.  The wet prep did not reveal any yeast infection or bacterial vaginosis.  Your GC/chlamydia test is pending, please create a MyChart to follow-up on this, if positive then please obtain antibiotics to treat this.  I suspect this will be negative as your cervix appeared unremarkable on my examination and you are not having any abnormal vaginal discharge.  Your urine did not reveal urinary tract infection.  Your headache improved with a migraine cocktail.  Your blood glucose improved with juice at bedside.  Please focus on eating regularly, as low blood sugar can certainly cause you to feel lightheaded and pass out.  Please focus on hydration as this will assist with lightheadedness.

## 2021-12-25 ENCOUNTER — Encounter (HOSPITAL_BASED_OUTPATIENT_CLINIC_OR_DEPARTMENT_OTHER): Payer: Self-pay | Admitting: Emergency Medicine

## 2021-12-25 ENCOUNTER — Other Ambulatory Visit: Payer: Self-pay

## 2021-12-25 ENCOUNTER — Emergency Department (HOSPITAL_BASED_OUTPATIENT_CLINIC_OR_DEPARTMENT_OTHER): Payer: 59

## 2021-12-25 ENCOUNTER — Emergency Department (HOSPITAL_BASED_OUTPATIENT_CLINIC_OR_DEPARTMENT_OTHER)
Admission: EM | Admit: 2021-12-25 | Discharge: 2021-12-25 | Disposition: A | Discharge status: Home / Self Care | Payer: 59 | Attending: Emergency Medicine | Admitting: Emergency Medicine

## 2021-12-25 DIAGNOSIS — R110 Nausea: Secondary | ICD-10-CM | POA: Diagnosis not present

## 2021-12-25 DIAGNOSIS — W19XXXA Unspecified fall, initial encounter: Secondary | ICD-10-CM | POA: Diagnosis not present

## 2021-12-25 DIAGNOSIS — S8992XA Unspecified injury of left lower leg, initial encounter: Secondary | ICD-10-CM | POA: Diagnosis present

## 2021-12-25 DIAGNOSIS — Z91040 Latex allergy status: Secondary | ICD-10-CM | POA: Insufficient documentation

## 2021-12-25 DIAGNOSIS — S80212A Abrasion, left knee, initial encounter: Secondary | ICD-10-CM | POA: Diagnosis not present

## 2021-12-25 DIAGNOSIS — R531 Weakness: Secondary | ICD-10-CM | POA: Diagnosis not present

## 2021-12-25 DIAGNOSIS — R519 Headache, unspecified: Secondary | ICD-10-CM | POA: Diagnosis not present

## 2021-12-25 DIAGNOSIS — R209 Unspecified disturbances of skin sensation: Secondary | ICD-10-CM | POA: Insufficient documentation

## 2021-12-25 LAB — CBC WITH DIFFERENTIAL/PLATELET
Abs Immature Granulocytes: 0.01 10*3/uL (ref 0.00–0.07)
Basophils Absolute: 0 10*3/uL (ref 0.0–0.1)
Basophils Relative: 1 %
Eosinophils Absolute: 0.2 10*3/uL (ref 0.0–0.5)
Eosinophils Relative: 3 %
HCT: 36.3 % (ref 36.0–46.0)
Hemoglobin: 12.3 g/dL (ref 12.0–15.0)
Immature Granulocytes: 0 %
Lymphocytes Relative: 55 %
Lymphs Abs: 3.1 10*3/uL (ref 0.7–4.0)
MCH: 29.3 pg (ref 26.0–34.0)
MCHC: 33.9 g/dL (ref 30.0–36.0)
MCV: 86.4 fL (ref 80.0–100.0)
Monocytes Absolute: 0.4 10*3/uL (ref 0.1–1.0)
Monocytes Relative: 7 %
Neutro Abs: 1.9 10*3/uL (ref 1.7–7.7)
Neutrophils Relative %: 34 %
Platelets: 162 10*3/uL (ref 150–400)
RBC: 4.2 MIL/uL (ref 3.87–5.11)
RDW: 14 % (ref 11.5–15.5)
WBC: 5.7 10*3/uL (ref 4.0–10.5)
nRBC: 0 % (ref 0.0–0.2)

## 2021-12-25 LAB — COMPREHENSIVE METABOLIC PANEL
ALT: 26 U/L (ref 0–44)
AST: 28 U/L (ref 15–41)
Albumin: 3.5 g/dL (ref 3.5–5.0)
Alkaline Phosphatase: 57 U/L (ref 38–126)
Anion gap: 6 (ref 5–15)
BUN: 12 mg/dL (ref 6–20)
CO2: 27 mmol/L (ref 22–32)
Calcium: 8.6 mg/dL — ABNORMAL LOW (ref 8.9–10.3)
Chloride: 104 mmol/L (ref 98–111)
Creatinine, Ser: 0.74 mg/dL (ref 0.44–1.00)
GFR, Estimated: >60 mL/min (ref >60)
Glucose, Bld: 73 mg/dL (ref 70–99)
Potassium: 3.4 mmol/L — ABNORMAL LOW (ref 3.5–5.1)
Sodium: 137 mmol/L (ref 135–145)
Total Bilirubin: 0.5 mg/dL (ref 0.3–1.2)
Total Protein: 7 g/dL (ref 6.5–8.1)

## 2021-12-25 LAB — URINALYSIS, ROUTINE W REFLEX MICROSCOPIC
Bilirubin Urine: NEGATIVE
Glucose, UA: NEGATIVE mg/dL
Ketones, ur: NEGATIVE mg/dL
Leukocytes,Ua: NEGATIVE
Nitrite: NEGATIVE
Protein, ur: NEGATIVE mg/dL
Specific Gravity, Urine: 1.025 (ref 1.005–1.030)
pH: 7 (ref 5.0–8.0)

## 2021-12-25 LAB — URINALYSIS, MICROSCOPIC (REFLEX)

## 2021-12-25 LAB — CK: Total CK: 187 U/L (ref 38–234)

## 2021-12-25 LAB — HCG, SERUM, QUALITATIVE: Preg, Serum: NEGATIVE

## 2021-12-25 LAB — LIPASE, BLOOD: Lipase: 38 U/L (ref 11–51)

## 2021-12-25 NOTE — ED Triage Notes (Signed)
Pt arrives pov with report of fall this morning, reports bilateral leg weakness, sent to ED by UC. Also reports HA, endorses ibuprofen at 0800 today with no relief. Pt also reports multiples complaints regarding freq falls and difficulty ambulating. Was treated at ED yesterday for pelvic issues.

## 2021-12-25 NOTE — ED Provider Notes (Signed)
[facility] EMERGENCY DEPARTMENT Provider Note   CSN: [id number] Arrival date & time: 12/25/21  1440    History  Chief Complaint  Patient presents with   Julia Gross is a 21 y.o. female here for evaluation of multiple complaints. Earlier today had a mechanical fall. HA since. Hx of similar. States she has appointment in march to see PCP due to chronic falls. States also with chronic headaches x months. No head trauma at onset of HA. Feels generally weak to BL upper and lower extremities. Was told previously she needs to see neuro for her recurrent weakness, per patient and friend in room. Occasionally has tingling "all over." No bowel or bladder incontinence, saddle anesthesia. No new neuro complaints since fall today. Has chronic nausea. Takes phenergan for similar with relief. Does use marijuana and occasional etoh. Seen 2 days ago in ED for pelic pain which has resolved. No syncope, blurred vision, midline C/T/L tenderness. No fever, CP, SOB, abd pain. Abrasion to left knee from fall. Unknown last tetanus. Ambulatory PTA. Seen by UC sent for CT head due to ha after fall.  HPI     Home Medications Prior to Admission medications   Medication Sig Start Date End Date Taking? Authorizing Provider  carbamide peroxide (DEBROX) 6.5 % OTIC solution Place 5 drops into the left ear 2 (two) times daily. 10/31/18   Cathie Hoops, Amy V, PA-C  fluticasone (FLONASE) 50 MCG/ACT nasal spray Place 2 sprays into both nostrils daily. 10/31/18   Cathie Hoops, Amy V, PA-C  nitrofurantoin, macrocrystal-monohydrate, (MACROBID) 100 MG capsule Take 1 capsule (100 mg total) by mouth 2 (two) times daily. 06/12/21   Zadie Rhine, MD  ondansetron (ZOFRAN ODT) 8 MG disintegrating tablet 8mg  ODT q4 hours prn nausea 06/12/21   Zadie Rhine, MD      Allergies    Latex    Review of Systems   Review of Systems  Constitutional: Negative.   HENT: Negative.    Respiratory: Negative.    Cardiovascular: Negative.    Gastrointestinal:  Positive for nausea (chronic).  Genitourinary: Negative.   Musculoskeletal:  Positive for myalgias (chronic myalgias).  Skin:  Positive for wound.  Neurological:  Positive for weakness (chronic generalized weakness) and headaches (chronic).  All other systems reviewed and are negative.  Physical Exam Updated Vital Signs BP 126/72 (BP Location: Right Arm)    Pulse 82    Temp 98.7 F (37.1 C) (Oral)    Resp 18    Ht 5\' 5"  (1.651 m)    Wt 68 kg    LMP 12/24/2021    SpO2 100%    BMI 24.96 kg/m  Physical Exam Physical Exam  Constitutional: Pt is oriented to person, place, and time. Pt appears well-developed and well-nourished. No distress.  HENT:  Head: Normocephalic and atraumatic.  Mouth/Throat: Oropharynx is clear and moist.  Eyes: Conjunctivae and EOM are normal. Pupils are equal, round, and reactive to light. No scleral icterus.  No horizontal, vertical or rotational nystagmus  Neck: Normal range of motion. Neck supple.  Full active and passive ROM without pain No midline or paraspinal tenderness No nuchal rigidity or meningeal signs  Cardiovascular: Normal rate, regular rhythm and intact distal pulses.   Pulmonary/Chest: Effort normal and breath sounds normal. No respiratory distress. Pt has no wheezes. No rales.  Abdominal: Soft. Bowel sounds are normal. There is no tenderness. There is no rebound and no guarding.  Musculoskeletal: Normal range of motion.  Lymphadenopathy:  No cervical adenopathy.  Neurological: Pt. is alert and oriented to person, place, and time. He has normal reflexes. No cranial nerve deficit.  Exhibits normal muscle tone. Coordination normal.  Mental Status:  Alert, oriented, thought content appropriate. Speech fluent without evidence of aphasia. Able to follow 2 step commands without difficulty.  Cranial Nerves:  II:  Peripheral visual fields grossly normal, pupils equal, round, reactive to light III,IV, VI: ptosis not present,  extra-ocular motions intact bilaterally  V,VII: smile symmetric, facial light touch sensation equal VIII: hearing grossly normal bilaterally  IX,X: midline uvula rise  XI: bilateral shoulder shrug equal and strong XII: midline tongue extension  Motor:  5/5 in upper and lower extremities bilaterally including strong and equal grip strength and dorsiflexion/plantar flexion Sensory: Pinprick and light touch normal in all extremities.  Deep Tendon Reflexes: 2+ and symmetric  Cerebellar: normal F 2 nose with bilateral upper extremities Gait: normal gait and balance CV: distal pulses palpable throughout   Skin: Skin is warm and dry. No rash noted. Pt is not diaphoretic.  Psychiatric: Pt has a normal mood and affect. Behavior is normal. Judgment and thought content normal.  Nursing note and vitals reviewed.  ED Results / Procedures / Treatments   Labs (all labs ordered are listed, but only abnormal results are displayed) Labs Reviewed  COMPREHENSIVE METABOLIC PANEL - Abnormal; Notable for the following components:      Result Value   Potassium 3.4 (*)    Calcium 8.6 (*)    All other components within normal limits  URINALYSIS, ROUTINE W REFLEX MICROSCOPIC - Abnormal; Notable for the following components:   Hgb urine dipstick LARGE (*)    All other components within normal limits  URINALYSIS, MICROSCOPIC (REFLEX) - Abnormal; Notable for the following components:   Bacteria, UA RARE (*)    All other components within normal limits  CBC WITH DIFFERENTIAL/PLATELET  LIPASE, BLOOD  HCG, SERUM, QUALITATIVE  CK    EKG None  Radiology CT Head Wo Contrast  Result Date: 12/25/2021 CLINICAL DATA:  Head trauma, moderate-severe EXAM: CT HEAD WITHOUT CONTRAST TECHNIQUE: Contiguous axial images were obtained from the base of the skull through the vertex without intravenous contrast. RADIATION DOSE REDUCTION: This exam was performed according to the departmental dose-optimization program which  includes automated exposure control, adjustment of the mA and/or kV according to patient size and/or use of iterative reconstruction technique. COMPARISON:  None. FINDINGS: Brain: No evidence of acute infarction, hemorrhage, hydrocephalus, extra-axial collection or mass lesion/mass effect. Vascular: No hyperdense vessel or unexpected calcification. Skull: Normal. Negative for fracture or focal lesion. Sinuses/Orbits: Mucosal thickening with layering debris in the right sphenoid sinus. Other: None. IMPRESSION: 1. No acute intracranial abnormality. 2. Mucosal thickening with layering debris in the right sphenoid sinus. Correlate for sinusitis. Electronically Signed   By: Duanne Guess D.O.   On: 12/25/2021 15:42   US PELVIS TRANSVAGINAL NON-OB (TV ONLY)  Result Date: 12/23/2021 CLINICAL DATA:  Left lower quadrant pain. EXAM: TRANSABDOMINAL AND TRANSVAGINAL ULTRASOUND OF PELVIS DOPPLER ULTRASOUND OF OVARIES TECHNIQUE: Both transabdominal and transvaginal ultrasound examinations of the pelvis were performed. Transabdominal technique was performed for global imaging of the pelvis including uterus, ovaries, adnexal regions, and pelvic cul-de-sac. It was necessary to proceed with endovaginal exam following the transabdominal exam to visualize the endometrium and ovaries. Color and duplex Doppler ultrasound was utilized to evaluate blood flow to the ovaries. COMPARISON:  None. FINDINGS: Uterus Measurements: 6.1 x 3.0 x 3.8 cm = volume: 36 mL.  No fibroids or other mass visualized. Endometrium Thickness: 5 mm.  No focal abnormality visualized. Right ovary Measurements: 2.9 x 1.4 x 2.5 cm = volume: 5 mL. Normal appearance/no adnexal mass. Left ovary Measurements: 2.1 x 1.3 x 1.5 cm = volume: 2 mL. Normal appearance/no adnexal mass. Pulsed Doppler evaluation of both ovaries demonstrates normal low-resistance arterial and venous waveforms. Other findings Small amount of free fluid in the pelvis. IMPRESSION: Unremarkable  pelvic ultrasound. Electronically Signed   By: Elgie Collard M.D.   On: 12/23/2021 22:31   US PELVIC DOPPLER (TORSION R/O OR MASS ARTERIAL FLOW)  Result Date: 12/23/2021 CLINICAL DATA:  Left lower quadrant pain. EXAM: TRANSABDOMINAL AND TRANSVAGINAL ULTRASOUND OF PELVIS DOPPLER ULTRASOUND OF OVARIES TECHNIQUE: Both transabdominal and transvaginal ultrasound examinations of the pelvis were performed. Transabdominal technique was performed for global imaging of the pelvis including uterus, ovaries, adnexal regions, and pelvic cul-de-sac. It was necessary to proceed with endovaginal exam following the transabdominal exam to visualize the endometrium and ovaries. Color and duplex Doppler ultrasound was utilized to evaluate blood flow to the ovaries. COMPARISON:  None. FINDINGS: Uterus Measurements: 6.1 x 3.0 x 3.8 cm = volume: 36 mL. No fibroids or other mass visualized. Endometrium Thickness: 5 mm.  No focal abnormality visualized. Right ovary Measurements: 2.9 x 1.4 x 2.5 cm = volume: 5 mL. Normal appearance/no adnexal mass. Left ovary Measurements: 2.1 x 1.3 x 1.5 cm = volume: 2 mL. Normal appearance/no adnexal mass. Pulsed Doppler evaluation of both ovaries demonstrates normal low-resistance arterial and venous waveforms. Other findings Small amount of free fluid in the pelvis. IMPRESSION: Unremarkable pelvic ultrasound. Electronically Signed   By: Elgie Collard M.D.   On: 12/23/2021 22:31   DG Knee Complete 4 Views Left  Result Date: 12/25/2021 CLINICAL DATA:  Larey Seat this morning.  Knee pain. EXAM: LEFT KNEE - COMPLETE 4+ VIEW COMPARISON:  None FINDINGS: No evidence of fracture, dislocation, or joint effusion. No evidence of arthropathy or other focal bone abnormality. Soft tissues are unremarkable. IMPRESSION: Negative. Electronically Signed   By: Norva Pavlov M.D.   On: 12/25/2021 15:43    Procedures Procedures    Medications Ordered in ED Medications - No data to display  ED Course/  Medical Decision Making/ A&P     21 year old here for evaluation of multiple complaints.  Mechanical fall earlier today. Hit posterior aspect head. Persistent HA. No LOC, anticoagulation. Some left knee pain. Ambulatory PTA. Non-focal naero exam and Nv intact.per patient hx of chronic generalized weakness and freq falls over the last 66months, has appointment with PCP in March for fu of this. Nonew neuro complaints today. No hx of MS, bowel or bladder incontinence, saddle anesthesia. No sx to suggest Myasthenia gravis, tetanus, rhabdo, neurosurgical emergency, tetanus, guillian barre, no IVDU low suspicion for osteomyelitis, transverse myelitis. Seen 2 days ago for abd pain. Labs and imaging reviewed. Also with chronic nausea x months. ABD Soft non tender. No rebound or guarding.  Lab and imaging personally viewed and interpreted:  CBC without leukocytosis, hemoglobin 12.3 CMP potassium 3.4, no significant abnormality CK 187 Lipase 38 Preg neg UA neg for infection Ct head without significant abnormality Dg left knee without significant abnormality  Patient reassessed, tolerating p.o. intake without difficulty.  She has no evidence of acute traumatic injuries from today's fall.  With regards to her generalized weakness, nausea over the last few months, unclear etiology.  We will have her follow-up with PCP for reevaluation of this.  Low suspicion  for acute abnormality requiring surgical or inpatient admission at this time  The patient has been appropriately medically screened and/or stabilized in the ED. I have low suspicion for any other emergent medical condition which would require further screening, evaluation or treatment in the ED or require inpatient management.  Patient is hemodynamically stable and in no acute distress.  Patient able to ambulate in department prior to ED.  Evaluation does not show acute pathology that would require ongoing or additional emergent interventions while in  the emergency department or further inpatient treatment.  I have discussed the diagnosis with the patient and answered all questions.  Pain is been managed while in the emergency department and patient has no further complaints prior to discharge.  Patient is comfortable with plan discussed in room and is stable for discharge at this time.  I have discussed strict return precautions for returning to the emergency department.  Patient was encouraged to follow-up with PCP/specialist refer to at discharge.                            Medical Decision Making Amount and/or Complexity of Data Reviewed Independent Historian: friend External Data Reviewed: labs, radiology and notes. Labs: ordered. Decision-making details documented in ED Course. Radiology: ordered and independent interpretation performed. Decision-making details documented in ED Course.  Risk OTC drugs. Prescription drug management. Risk Details: DO not eel patient needs additional labs, imaging, hospitalization at this time          Final Clinical Impression(s) / ED Diagnoses Final diagnoses:  Fall, initial encounter  Acute nonintractable headache, unspecified headache type    Rx / DC Orders ED Discharge Orders     None         Letroy Vazguez A, PA-C 12/25/21 1635    Linwood DibblesKnapp, Jon, MD 12/26/21 1718

## 2021-12-25 NOTE — Discharge Instructions (Signed)
Take Tylenol Motrin as needed for pain.  Follow-up with your primary care provider  Return for new or worsening symptoms

## 2022-01-19 ENCOUNTER — Other Ambulatory Visit: Payer: Self-pay | Admitting: Physician Assistant

## 2022-01-19 DIAGNOSIS — R748 Abnormal levels of other serum enzymes: Secondary | ICD-10-CM

## 2022-02-24 ENCOUNTER — Ambulatory Visit
Admission: RE | Admit: 2022-02-24 | Discharge: 2022-02-24 | Disposition: A | Discharge status: Home / Self Care | Payer: PRIVATE HEALTH INSURANCE | Source: Ambulatory Visit | Attending: Physician Assistant | Admitting: Physician Assistant

## 2022-02-24 DIAGNOSIS — R748 Abnormal levels of other serum enzymes: Secondary | ICD-10-CM

## 2022-03-16 ENCOUNTER — Other Ambulatory Visit: Payer: Self-pay | Admitting: Emergency Medicine

## 2022-03-16 DIAGNOSIS — E049 Nontoxic goiter, unspecified: Secondary | ICD-10-CM

## 2022-03-21 ENCOUNTER — Ambulatory Visit
Admission: RE | Admit: 2022-03-21 | Discharge: 2022-03-21 | Disposition: A | Discharge status: Home / Self Care | Payer: PRIVATE HEALTH INSURANCE | Source: Ambulatory Visit | Attending: Emergency Medicine | Admitting: Emergency Medicine

## 2022-03-21 DIAGNOSIS — E049 Nontoxic goiter, unspecified: Secondary | ICD-10-CM

## 2022-12-24 IMAGING — US US ABDOMEN LIMITED
1 series · 14 of 25 positions shown · non-contrast
Comparison: None.

CLINICAL DATA: Elevated LFTs

EXAM:
ULTRASOUND ABDOMEN LIMITED RIGHT UPPER QUADRANT

[Series 1: us abdomen limited · 0.19mm/px · 14 of 56 slices shown]
[im 1/56]
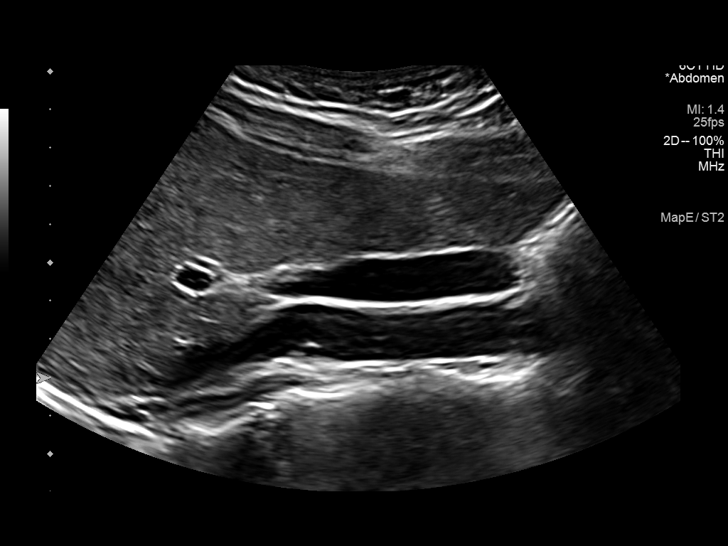
[im 5/56]
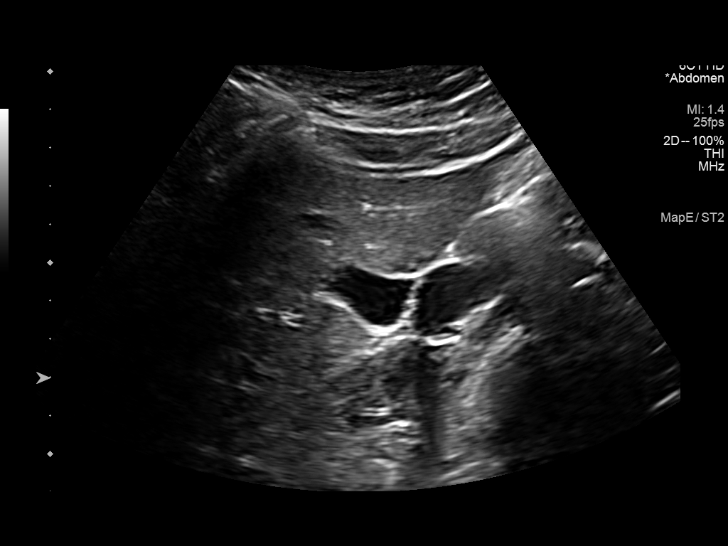
[im 10/56]
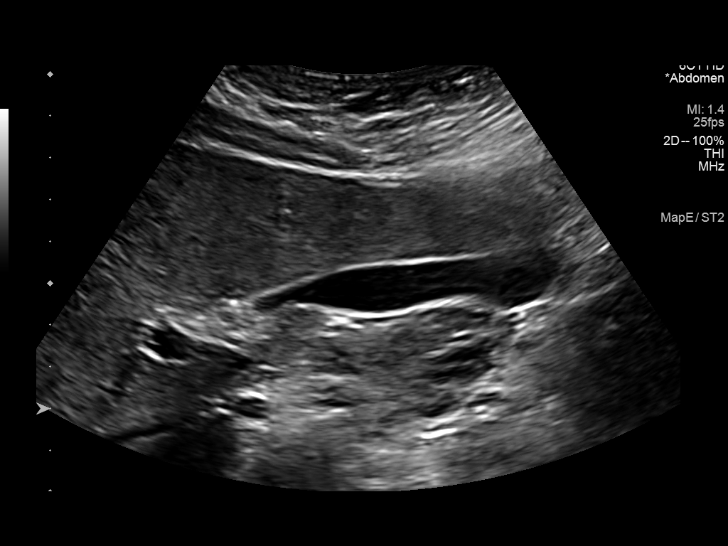
[im 14/56]
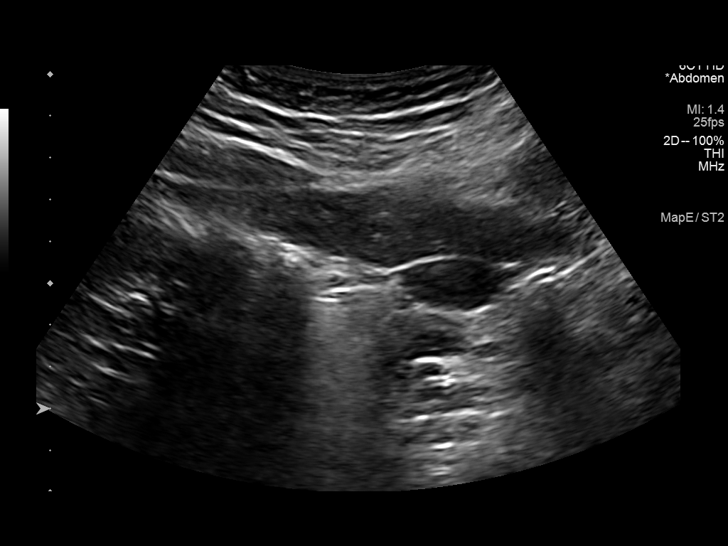
[im 19/56]
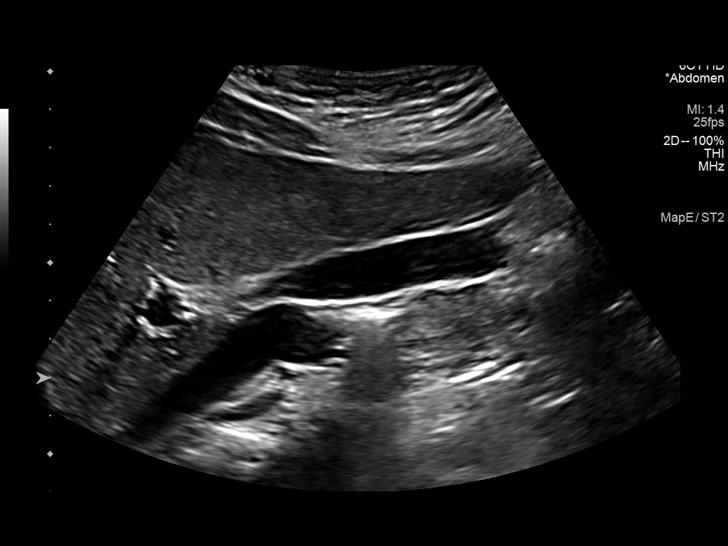
[im 21/56]
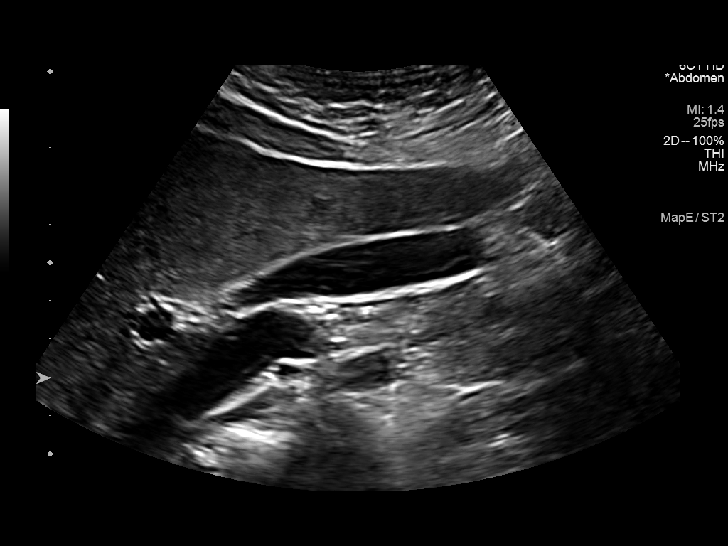
[im 26/56]
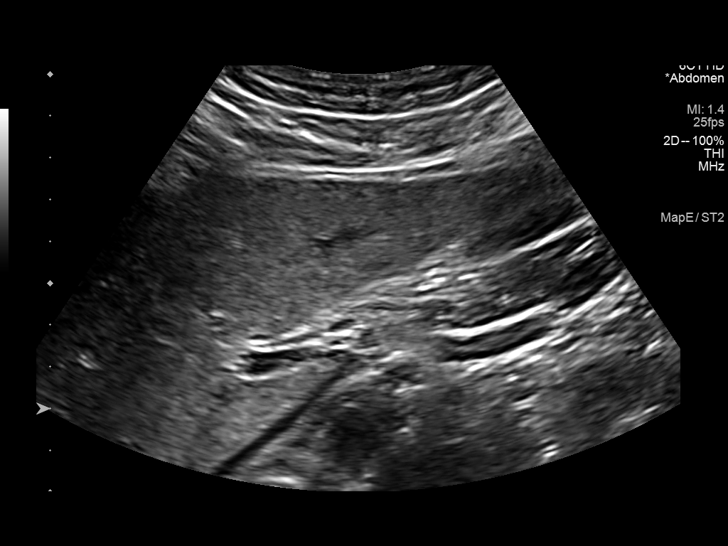
[im 30/56]
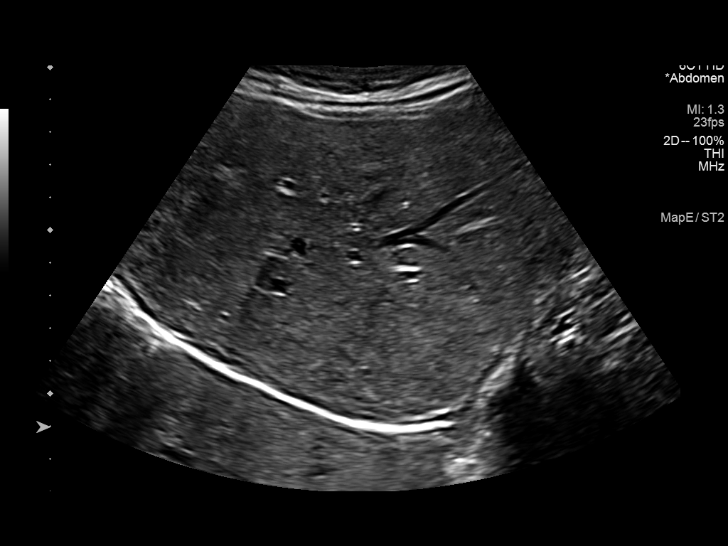
[im 35/56]
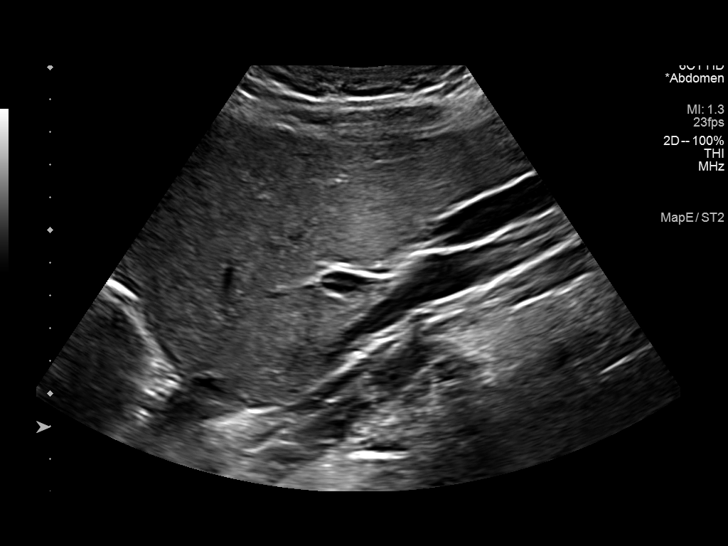
[im 37/56]
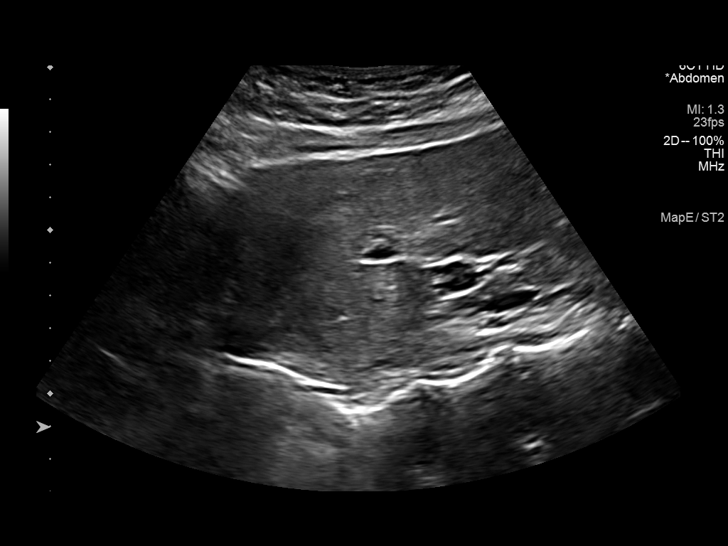
[im 42/56]
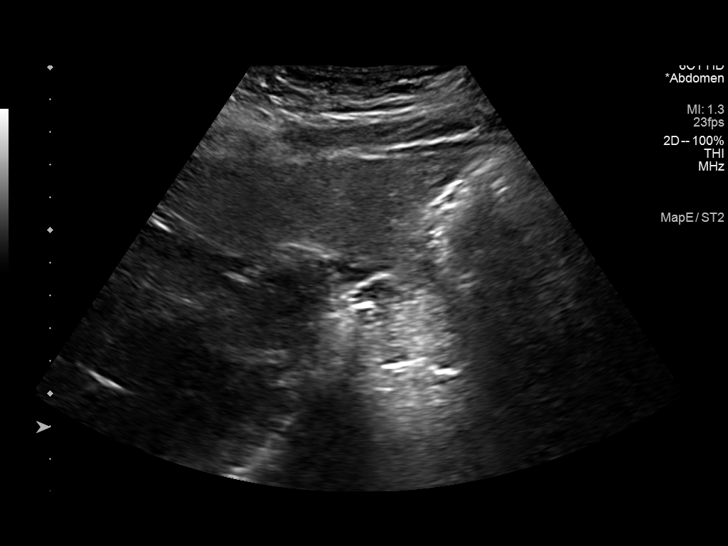
[im 46/56]
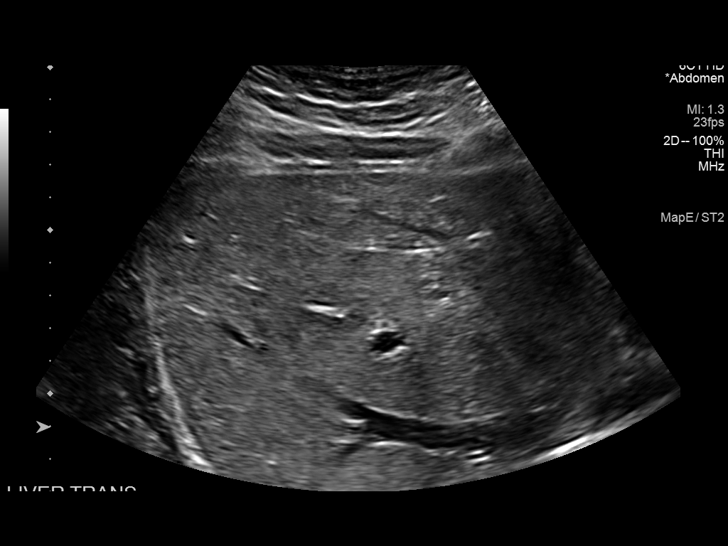
[im 51/56]
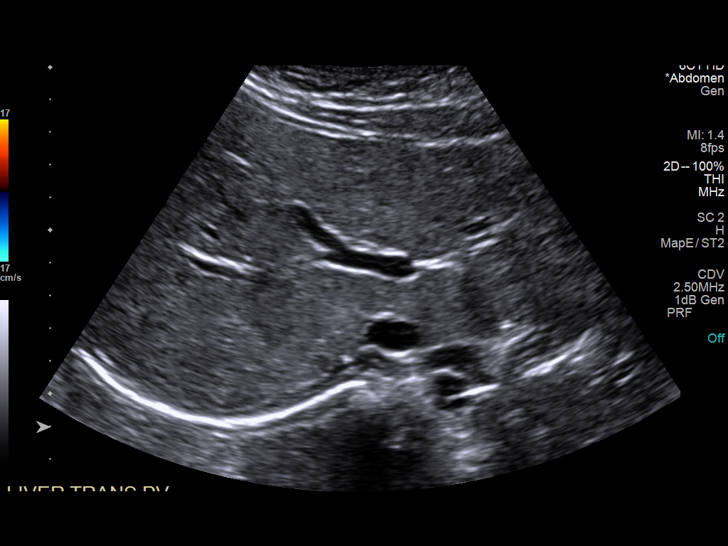
[im 56/56]
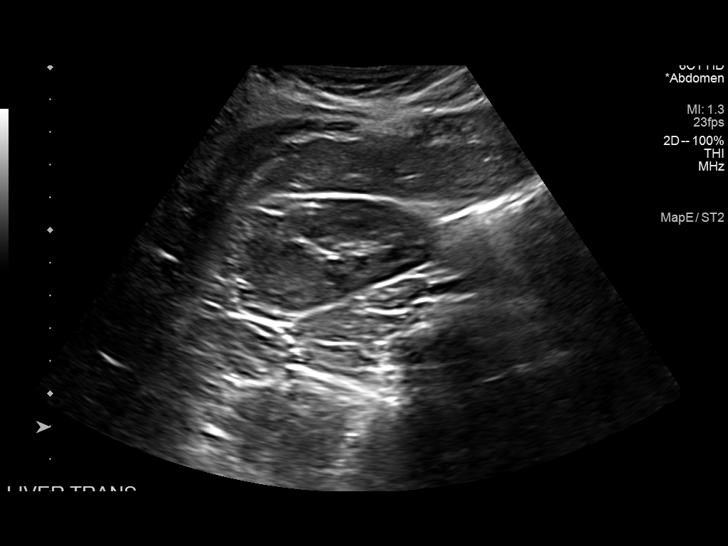

[14 of 25 positions shown; findings below may reference images not displayed]

FINDINGS: Gallbladder:

No gallstones or wall thickening visualized. No sonographic Murphy
sign noted by sonographer.

Common bile duct:

Diameter: 2 mm

Liver:

No focal lesion identified. Within normal limits in parenchymal
echogenicity. Portal vein is patent on color Doppler imaging with
normal direction of blood flow towards the liver.

Other: None.
IMPRESSION: Unremarkable abdominal sonogram.

## 2023-11-29 ENCOUNTER — Other Ambulatory Visit: Payer: Self-pay

## 2023-11-29 ENCOUNTER — Ambulatory Visit
Admission: EM | Admit: 2023-11-29 | Discharge: 2023-11-29 | Disposition: A | Discharge status: Home / Self Care | Payer: MEDICAID

## 2023-11-29 DIAGNOSIS — K5900 Constipation, unspecified: Secondary | ICD-10-CM | POA: Diagnosis not present

## 2023-11-29 NOTE — ED Triage Notes (Addendum)
Vomiting since this morning. Had chills and temperature going up and down, woke up sweating with body pain and headache. Has not had otc medications. Also states she has not had a bm in 2 weeks. Also reports she felt so bad today she "had to smoke" marijuana today.

## 2023-11-29 NOTE — ED Provider Notes (Signed)
[facility]    CSN: [id number] Arrival date & time: 11/29/23  1819      History   Chief Complaint Chief Complaint  Patient presents with   Emesis    HPI Julia Gross is a 23 y.o. female.   HPI 23 year old female presents with vomiting that began this morning.  Patient reports not having bowel movement in 2 weeks.  Patient reports pain has been so bad that she had to smoke marijuana today.  PMH significant for ADHD.  Patient is accompanied by her friend this evening.  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      Home Medications    Prior to Admission medications   Medication Sig Start Date End Date Taking? Authorizing Provider  hydrOXYzine (VISTARIL) 25 MG capsule Take 25 mg by mouth 3 (three) times daily as needed.   Yes [provider]    Family History Family History  Problem Relation Age of Onset   Depression Mother    Anxiety disorder Mother    Depression Maternal Grandmother    Arthritis Maternal Grandmother    Diabetes Maternal Grandfather    Cancer Maternal Grandfather    ADD / ADHD Cousin     Social History Social History   Tobacco Use   Smoking status: Never   Smokeless tobacco: Never  Substance Use Topics   Alcohol use: Not Currently   Drug use: Yes    Types: Marijuana     Allergies   Latex   Review of Systems Review of Systems  Gastrointestinal:  Positive for vomiting.  All other systems reviewed and are negative.    Physical Exam Triage Vital Signs ED Triage Vitals  Encounter Vitals Group     BP      Systolic BP  Percentile      Diastolic BP Percentile      Pulse      Resp      Temp      Temp src      SpO2      Weight      Height      Head Circumference      Peak Flow      Pain Score      Pain Loc      Pain Education      Exclude from Growth Chart    No data found.  Updated Vital Signs BP 110/75   Pulse (!) 105   Temp 98.5 F (36.9 C)   Resp 16   SpO2 99%      Physical Exam Vitals and nursing note reviewed.  Constitutional:      General: She is not in acute distress.    Appearance: Normal appearance. She is normal weight. She is not ill-appearing.  HENT:     Head: Normocephalic and atraumatic.     Mouth/Throat:     Mouth: Mucous membranes are moist.     Pharynx: Oropharynx is clear.  Eyes:     Extraocular Movements: Extraocular movements intact.     Conjunctiva/sclera: Conjunctivae normal.  Pupils: Pupils are equal, round, and reactive to light.  Cardiovascular:     Rate and Rhythm: Normal rate and regular rhythm.     Pulses: Normal pulses.     Heart sounds: Normal heart sounds.  Pulmonary:     Effort: Pulmonary effort is normal.     Breath sounds: Normal breath sounds. No wheezing, rhonchi or rales.  Musculoskeletal:        General: Normal range of motion.     Cervical back: Normal range of motion and neck supple.  Skin:    General: Skin is warm and dry.  Neurological:     General: No focal deficit present.     Mental Status: She is alert and oriented to person, place, and time. Mental status is at baseline.  Psychiatric:        Mood and Affect: Mood normal.        Behavior: Behavior normal.      UC Treatments / Results  Labs (all labs ordered are listed, but only abnormal results are displayed) Labs Reviewed - No data to display  EKG   Radiology No results found.  Procedures Procedures (including critical care time)  Medications Ordered in UC Medications - No data to display  Initial Impression / Assessment and Plan / UC Course  I have  reviewed the triage vital signs and the nursing notes.  Pertinent labs & imaging results that were available during my care of the patient were reviewed by me and considered in my medical decision making (see chart for details).     MDM: 1.  Constipation, unspecified type-Advised patient to go to [facility] ED now for further evaluation of current symptoms.  Patient agreed and verbalized understanding of these instructions and this plan of care.  Patient discharged to ED, hemodynamically stable. Final Clinical Impressions(s) / UC Diagnoses   Final diagnoses:  Constipation, unspecified constipation type     Discharge Instructions      Advised patient to go to [facility] ED now for further evaluation of current symptoms.     ED Prescriptions   None    PDMP not reviewed this encounter.   Trevor Iha, FNP 11/29/23 1912

## 2023-11-29 NOTE — Discharge Instructions (Addendum)
Advised patient to go to [facility] ED now for further evaluation of current symptoms.
